# Patient Record
Sex: Male | Born: 2000 | Race: Black or African American | Hispanic: No | Marital: Single | Smoking: Never smoker
Health system: Southern US, Community
[De-identification: ages and names within clinical notes are randomized; demographics above are authoritative.]

## PROBLEM LIST (undated history)

## (undated) DIAGNOSIS — F988 Other specified behavioral and emotional disorders with onset usually occurring in childhood and adolescence: Secondary | ICD-10-CM

## (undated) HISTORY — DX: Other specified behavioral and emotional disorders with onset usually occurring in childhood and adolescence: F98.8

---
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
## 2021-04-14 ENCOUNTER — Encounter: Payer: Self-pay | Admitting: Emergency Medicine

## 2021-04-14 ENCOUNTER — Other Ambulatory Visit: Payer: Self-pay

## 2021-04-14 ENCOUNTER — Ambulatory Visit
Admission: EM | Admit: 2021-04-14 | Discharge: 2021-04-14 | Disposition: A | Discharge status: Home / Self Care | Payer: Medicaid Other | Attending: Family Medicine | Admitting: Family Medicine

## 2021-04-14 DIAGNOSIS — J111 Influenza due to unidentified influenza virus with other respiratory manifestations: Secondary | ICD-10-CM

## 2021-04-14 LAB — POCT INFLUENZA A/B
Influenza A, POC: NEGATIVE
Influenza B, POC: NEGATIVE

## 2021-04-14 NOTE — ED Provider Notes (Signed)
  [facility]   [id number] 04/14/21 Arrival Time: 1357  ASSESSMENT & PLAN:  1. Influenza-like illness    Rapid flu testing negative. Discussed typical duration of viral illnesses. Work note provided. OTC symptom care as needed.   Follow-up Information    Luking, Jonna Coup, MD.   Specialty: Family Medicine Why: As needed. Contact information: [address] [place] [address] [phone]               Reviewed expectations re: course of current medical issues. Questions answered. Outlined signs and symptoms indicating need for more acute intervention. Understanding verbalized. After Visit Summary given.   SUBJECTIVE: History from: patient. Kenneth Webster is a 20 y.o. male who reports influenza exposure. With body aches, ST, HA, "feeling hot" since yesterday; mild cough. Denies: fever and difficulty breathing. Normal PO intake without n/v/d.  OBJECTIVE:  Vitals:   04/14/21 1408  BP: 126/71  Pulse: 86  Resp: 17  Temp: 99 F (37.2 C)  TempSrc: Oral  SpO2: 96%    General appearance: alert; no distress; appears fatigued Eyes: PERRLA; EOMI; conjunctiva normal HENT: [place]; AT; with nasal congestion Neck: supple  Lungs: speaks full sentences without difficulty; unlabored; CTAB Extremities: no edema Skin: warm and dry Neurologic: normal gait Psychological: alert and cooperative; normal mood and affect  Labs: Results for orders placed or performed during the hospital encounter of 04/14/21  POCT Influenza A/B  Result Value Ref Range   Influenza A, POC Negative Negative   Influenza B, POC Negative Negative   Labs Reviewed  POCT INFLUENZA A/B    No Known Allergies  Past Medical History:  Diagnosis Date  . ADD (attention deficit disorder)    Social History   Socioeconomic History  . Marital status: Single    Spouse name: Not on file  . Number of children: Not on file  . Years of education: Not on file  . Highest education  level: Not on file  Occupational History  . Not on file  Tobacco Use  . Smoking status: Never Smoker  . Smokeless tobacco: Never Used  Substance and Sexual Activity  . Alcohol use: Never  . Drug use: Never  . Sexual activity: Not on file  Other Topics Concern  . Not on file  Social History Narrative  . Not on file   Social Determinants of Health   Financial Resource Strain: Not on file  Food Insecurity: Not on file  Transportation Needs: Not on file  Physical Activity: Not on file  Stress: Not on file  Social Connections: Not on file  Intimate Partner Violence: Not on file   No family history on file. History reviewed. No pertinent surgical history.   Mardella Layman, MD 04/14/21 [phone]

## 2021-04-14 NOTE — ED Triage Notes (Signed)
Body aches, sore throat, headache, feels hot since yesterday.  Was around someone who had the flu recently.

## 2021-04-14 NOTE — Discharge Instructions (Signed)
Follow up with your primary care doctor or here if you are not seeing improvement of your symptoms over the next several days, sooner if you feel you are worsening.  Caring for yourself: Get plenty of rest. Drink plenty of fluids, enough so that your urine is light yellow or clear like water. If you have kidney, heart, or liver disease and have to limit fluids, talk with your doctor before you increase the amount of fluids you drink. Take an over-the-counter pain medicine if needed, such as acetaminophen (Tylenol), ibuprofen (Advil, Motrin), or naproxen (Aleve), to relieve fever, headache, and muscle aches. Read and follow all instructions on the label. No one younger than 20 should take aspirin. It has been linked to Reye syndrome, a serious illness. Before you use over the counter cough and cold medicines, check the label. These medicines may not be safe for children younger than age 6 or for people with certain health problems. If the skin around your nose and lips becomes sore, put some petroleum jelly on the area.  Avoid spreading a virus: Wash your hands regularly, and keep your hands away from your face.  Stay home from school, work, and other public places until you are feeling better and your fever has been gone for at least 24 hours. The fever needs to have gone away on its own without the help of medicine.  

## 2021-12-29 ENCOUNTER — Encounter (HOSPITAL_COMMUNITY): Payer: Self-pay | Admitting: *Deleted

## 2021-12-29 ENCOUNTER — Other Ambulatory Visit: Payer: Self-pay

## 2021-12-29 DIAGNOSIS — K029 Dental caries, unspecified: Secondary | ICD-10-CM | POA: Diagnosis not present

## 2021-12-29 DIAGNOSIS — K0889 Other specified disorders of teeth and supporting structures: Secondary | ICD-10-CM | POA: Diagnosis not present

## 2021-12-29 NOTE — ED Triage Notes (Signed)
Pt with right upper dental pain for past 2 weeks but worse today.  Has tried OTC gel and no relief per pt.

## 2021-12-30 ENCOUNTER — Emergency Department (HOSPITAL_COMMUNITY)
Admission: EM | Admit: 2021-12-30 | Discharge: 2021-12-30 | Disposition: A | Discharge status: Home / Self Care | Payer: Medicaid Other | Attending: Emergency Medicine | Admitting: Emergency Medicine

## 2021-12-30 DIAGNOSIS — K0889 Other specified disorders of teeth and supporting structures: Secondary | ICD-10-CM

## 2021-12-30 MED ORDER — HYDROCODONE-ACETAMINOPHEN 5-325 MG PO TABS
1.0000 | ORAL_TABLET | Freq: Once | ORAL | Status: AC
  Administered 2021-12-30: 1 via ORAL
  Filled 2021-12-30: qty 1

## 2021-12-30 MED ORDER — AMOXICILLIN 500 MG PO CAPS: 500.0000 mg | ORAL_CAPSULE | Freq: Three times a day (TID) | ORAL | 0 refills | Status: AC

## 2021-12-30 MED ORDER — IBUPROFEN 800 MG PO TABS: 800.0000 mg | ORAL_TABLET | Freq: Four times a day (QID) | ORAL | 0 refills | Status: AC | PRN

## 2021-12-30 MED ORDER — IBUPROFEN 800 MG PO TABS
800.0000 mg | ORAL_TABLET | Freq: Once | ORAL | Status: AC
  Administered 2021-12-30: 800 mg via ORAL
  Filled 2021-12-30: qty 1

## 2021-12-30 MED ORDER — AMOXICILLIN 250 MG PO CAPS
500.0000 mg | ORAL_CAPSULE | Freq: Once | ORAL | Status: AC
  Administered 2021-12-30: 500 mg via ORAL
  Filled 2021-12-30: qty 2

## 2021-12-30 NOTE — ED Provider Notes (Signed)
[facility] EMERGENCY DEPARTMENT Provider Note   CSN: [id number] Arrival date & time: 12/29/21  2225     History  Chief Complaint  Patient presents with   Dental Pain    Kenneth Webster is a 21 y.o. male.  Patient presents to the emergency department for evaluation of dental pain.  Patient reports that he has been having a tooth ache for 2 weeks but it significantly worsened tonight.  He has tried over-the-counter medications without improvement.      Home Medications Prior to Admission medications   Medication Sig Start Date End Date Taking? Authorizing Provider  amoxicillin (AMOXIL) 500 MG capsule Take 1 capsule (500 mg total) by mouth 3 (three) times daily. 12/30/21  Yes Evens Meno, Gwenyth Allegra, MD  ibuprofen (ADVIL) 800 MG tablet Take 1 tablet (800 mg total) by mouth every 6 (six) hours as needed for moderate pain. 12/30/21  Yes Yurika Pereda, Gwenyth Allegra, MD  loratadine (CLARITIN) 10 MG tablet TAKE 1 TABLET BY MOUTH ONCE DAILY. Patient not taking: Reported on 04/20/2016 05/07/15   Mikey Kirschner, MD  methylphenidate (METADATE CD) 20 MG CR capsule Take 1 capsule (20 mg total) by mouth every morning. 04/20/16   Kathyrn Drown, MD      Allergies    Patient has no known allergies.    Review of Systems   Review of Systems  HENT:  Positive for dental problem.    Physical Exam Updated Vital Signs BP (!) 151/112 (BP Location: Right Arm)    Pulse 67    Temp 98.4 F (36.9 C) (Oral)    Resp 18    Ht 5\' 8"  (1.727 m)    Wt 56.7 kg    SpO2 98%    BMI 19.01 kg/m  Physical Exam Vitals and nursing note reviewed.  Constitutional:      General: He is not in acute distress.    Appearance: Normal appearance. He is well-developed.  HENT:     Head: Normocephalic and atraumatic.     Right Ear: Hearing normal.     Left Ear: Hearing normal.     Nose: Nose normal.     Mouth/Throat:     Dentition: Dental tenderness and dental caries present. No dental abscesses.   Eyes:      Conjunctiva/sclera: Conjunctivae normal.     Pupils: Pupils are equal, round, and reactive to light.  Cardiovascular:     Rate and Rhythm: Regular rhythm.     Heart sounds: S1 normal and S2 normal. No murmur heard.   No friction rub. No gallop.  Pulmonary:     Effort: Pulmonary effort is normal. No respiratory distress.     Breath sounds: Normal breath sounds.  Chest:     Chest wall: No tenderness.  Abdominal:     General: Bowel sounds are normal.     Palpations: Abdomen is soft.     Tenderness: There is no abdominal tenderness. There is no guarding or rebound. Negative signs include Murphy's sign and McBurney's sign.     Hernia: No hernia is present.  Musculoskeletal:        General: Normal range of motion.     Cervical back: Normal range of motion and neck supple.  Skin:    General: Skin is warm and dry.     Findings: No rash.  Neurological:     Mental Status: He is alert and oriented to person, place, and time.     GCS: GCS eye subscore is 4. GCS  verbal subscore is 5. GCS motor subscore is 6.     Cranial Nerves: No cranial nerve deficit.     Sensory: No sensory deficit.     Coordination: Coordination normal.  Psychiatric:        Speech: Speech normal.        Behavior: Behavior normal.        Thought Content: Thought content normal.    ED Results / Procedures / Treatments   Labs (all labs ordered are listed, but only abnormal results are displayed) Labs Reviewed - No data to display  EKG None  Radiology No results found.  Procedures Procedures    Medications Ordered in ED Medications - No data to display  ED Course/ Medical Decision Making/ A&P                           Medical Decision Making  Patient with dental pain associated with a large cavity of right upper premolar without abscess formation.        Final Clinical Impression(s) / ED Diagnoses Final diagnoses:  Pain, dental    Rx / DC Orders ED Discharge Orders          Ordered     amoxicillin (AMOXIL) 500 MG capsule  3 times daily        12/30/21 0035    ibuprofen (ADVIL) 800 MG tablet  Every 6 hours PRN        12/30/21 0035              Orpah Greek, MD 12/30/21 [phone]

## 2021-12-31 ENCOUNTER — Telehealth: Payer: Self-pay

## 2021-12-31 NOTE — Telephone Encounter (Signed)
Transition Care Management Unsuccessful Follow-up Telephone Call  Date of discharge and from where:  01/18//2023 from [facility]  Attempts:  1st Attempt  Reason for unsuccessful TCM follow-up call:  Left voice message

## 2022-01-01 NOTE — Telephone Encounter (Signed)
Transition Care Management Unsuccessful Follow-up Telephone Call  Date of discharge and from where:  12/30/2021 from [facility]  Attempts:  2nd Attempt  Reason for unsuccessful TCM follow-up call:  No answer/busy

## 2022-01-04 NOTE — Telephone Encounter (Signed)
Transition Care Management Unsuccessful Follow-up Telephone Call  Date of discharge and from where:  12/30/2021-[facility]   Attempts:  3rd Attempt  Reason for unsuccessful TCM follow-up call:  No answer/busy

## 2022-01-18 ENCOUNTER — Ambulatory Visit
Admission: EM | Admit: 2022-01-18 | Discharge: 2022-01-18 | Disposition: A | Discharge status: Home / Self Care | Payer: Medicaid Other | Attending: Family Medicine | Admitting: Family Medicine

## 2022-01-18 ENCOUNTER — Other Ambulatory Visit: Payer: Self-pay

## 2022-01-18 DIAGNOSIS — R112 Nausea with vomiting, unspecified: Secondary | ICD-10-CM | POA: Diagnosis not present

## 2022-01-18 MED ORDER — ONDANSETRON 4 MG PO TBDP
4.0000 mg | ORAL_TABLET | Freq: Once | ORAL | Status: AC
  Administered 2022-01-18: 4 mg via ORAL

## 2022-01-18 MED ORDER — ONDANSETRON 4 MG PO TBDP: 4.0000 mg | ORAL_TABLET | Freq: Three times a day (TID) | ORAL | 0 refills | Status: DC | PRN

## 2022-01-18 NOTE — ED Triage Notes (Signed)
Pt presents with nausea and vomiting that began last night after eating chicken last night

## 2022-01-19 NOTE — ED Provider Notes (Signed)
[facility]    CSN: [id number] Arrival date & time: 01/18/22  1518      History   Chief Complaint Chief Complaint  Patient presents with   Nausea   Emesis    HPI Kenneth Webster is a 21 y.o. male.   Presenting today with 1 day history of nausea, vomiting, generalized abdominal pain that started last night after eating some chicken.  Denies diarrhea, fever, chills, body aches, headache, upper respiratory symptoms.  So far not trying anything over-the-counter for symptoms.  States he has not eaten all day and that seems to have helped improve his symptoms somewhat.  No known sick contacts recently.  No known chronic GI issues.   Past Medical History:  Diagnosis Date   ADD (attention deficit disorder)     Patient Active Problem List   Diagnosis Date Noted   ADD (attention deficit disorder) 05/17/2013    History reviewed. No pertinent surgical history.     Home Medications    Prior to Admission medications   Medication Sig Start Date End Date Taking? Authorizing Provider  ondansetron (ZOFRAN-ODT) 4 MG disintegrating tablet Take 1 tablet (4 mg total) by mouth every 8 (eight) hours as needed for nausea or vomiting. 01/18/22  Yes Particia Nearing, PA-C  amoxicillin (AMOXIL) 500 MG capsule Take 1 capsule (500 mg total) by mouth 3 (three) times daily. 12/30/21   Gilda Crease, MD  ibuprofen (ADVIL) 800 MG tablet Take 1 tablet (800 mg total) by mouth every 6 (six) hours as needed for moderate pain. 12/30/21   Gilda Crease, MD  loratadine (CLARITIN) 10 MG tablet TAKE 1 TABLET BY MOUTH ONCE DAILY. Patient not taking: Reported on 04/20/2016 05/07/15   Merlyn Albert, MD  methylphenidate (METADATE CD) 20 MG CR capsule Take 1 capsule (20 mg total) by mouth every morning. 04/20/16   Babs Sciara, MD    Family History History reviewed. No pertinent family history.  Social History Social History   Tobacco Use   Smoking status: Never    Smokeless tobacco: Never  Substance Use Topics   Alcohol use: Never   Drug use: Never     Allergies   Patient has no known allergies.   Review of Systems Review of Systems Per HPI  Physical Exam Triage Vital Signs ED Triage Vitals  Enc Vitals Group     BP 01/18/22 1718 100/66     Pulse Rate 01/18/22 1718 62     Resp 01/18/22 1718 20     Temp 01/18/22 1718 98.3 F (36.8 C)     Temp src --      SpO2 01/18/22 1718 98 %     Weight --      Height --      Head Circumference --      Peak Flow --      Pain Score 01/18/22 1717 0     Pain Loc --      Pain Edu? --      Excl. in GC? --    No data found.  Updated Vital Signs BP 100/66    Pulse 62    Temp 98.3 F (36.8 C)    Resp 20    SpO2 98%   Visual Acuity Right Eye Distance:   Left Eye Distance:   Bilateral Distance:    Right Eye Near:   Left Eye Near:    Bilateral Near:     Physical Exam Vitals and nursing note reviewed.  Constitutional:      Appearance: Normal appearance.  HENT:     Head: Atraumatic.     Mouth/Throat:     Mouth: Mucous membranes are moist.  Eyes:     Extraocular Movements: Extraocular movements intact.     Conjunctiva/sclera: Conjunctivae normal.  Cardiovascular:     Rate and Rhythm: Normal rate and regular rhythm.  Pulmonary:     Effort: Pulmonary effort is normal.     Breath sounds: Normal breath sounds.  Abdominal:     General: Bowel sounds are normal. There is no distension.     Palpations: Abdomen is soft.     Tenderness: There is no abdominal tenderness. There is no right CVA tenderness, left CVA tenderness or guarding.  Musculoskeletal:        General: Normal range of motion.     Cervical back: Normal range of motion and neck supple.  Skin:    General: Skin is warm and dry.  Neurological:     General: No focal deficit present.     Mental Status: He is oriented to person, place, and time.  Psychiatric:        Mood and Affect: Mood normal.        Thought Content: Thought  content normal.        Judgment: Judgment normal.     UC Treatments / Results  Labs (all labs ordered are listed, but only abnormal results are displayed) Labs Reviewed - No data to display  EKG   Radiology No results found.  Procedures Procedures (including critical care time)  Medications Ordered in UC Medications  ondansetron (ZOFRAN-ODT) disintegrating tablet 4 mg (4 mg Oral Given 01/18/22 1812)    Initial Impression / Assessment and Plan / UC Course  I have reviewed the triage vital signs and the nursing notes.  Pertinent labs & imaging results that were available during my care of the patient were reviewed by me and considered in my medical decision making (see chart for details).     Vital signs benign and reassuring, exam with no red flag findings.  Zofran given for acute nausea in clinic, Zofran also sent to pharmacy.  Suspect viral GI illness causing symptoms.  Brat diet, fluids, work note given.  Return for acutely worsening symptoms.  Final Clinical Impressions(s) / UC Diagnoses   Final diagnoses:  Nausea and vomiting, unspecified vomiting type   Discharge Instructions   None    ED Prescriptions     Medication Sig Dispense Auth. Provider   ondansetron (ZOFRAN-ODT) 4 MG disintegrating tablet Take 1 tablet (4 mg total) by mouth every 8 (eight) hours as needed for nausea or vomiting. 20 tablet Particia Nearing, [place]      PDMP not reviewed this encounter.   Roosvelt Maser Palmer, [place] 01/19/22 [phone]

## 2022-02-05 ENCOUNTER — Ambulatory Visit
Admission: EM | Admit: 2022-02-05 | Discharge: 2022-02-05 | Disposition: A | Discharge status: Home / Self Care | Payer: Medicaid Other | Attending: Family Medicine | Admitting: Family Medicine

## 2022-02-05 ENCOUNTER — Encounter: Payer: Self-pay | Admitting: Emergency Medicine

## 2022-02-05 ENCOUNTER — Other Ambulatory Visit: Payer: Self-pay

## 2022-02-05 DIAGNOSIS — R519 Headache, unspecified: Secondary | ICD-10-CM | POA: Diagnosis not present

## 2022-02-05 DIAGNOSIS — R110 Nausea: Secondary | ICD-10-CM | POA: Diagnosis not present

## 2022-02-05 MED ORDER — ONDANSETRON 4 MG PO TBDP: 4.0000 mg | ORAL_TABLET | Freq: Three times a day (TID) | ORAL | 0 refills | Status: AC | PRN

## 2022-02-05 NOTE — ED Provider Notes (Signed)
[facility]    CSN: [id number] Arrival date & time: 02/05/22  1428      History   Chief Complaint No chief complaint on file.   HPI Kenneth Webster is a 21 y.o. male.   Presenting today with 1 day history of nausea, malaise, headache.  Denies vomiting, diarrhea, abdominal pain, chest pain, shortness of breath, cough, sore throat, diarrhea, constipation.  So far not trying anything over-the-counter for symptoms.  No new foods, medications recently.  No new sick contacts recently.   Past Medical History:  Diagnosis Date   ADD (attention deficit disorder)     Patient Active Problem List   Diagnosis Date Noted   ADD (attention deficit disorder) 05/17/2013    History reviewed. No pertinent surgical history.     Home Medications    Prior to Admission medications   Medication Sig Start Date End Date Taking? Authorizing Provider  amoxicillin (AMOXIL) 500 MG capsule Take 1 capsule (500 mg total) by mouth 3 (three) times daily. 12/30/21   Gilda Crease, MD  ibuprofen (ADVIL) 800 MG tablet Take 1 tablet (800 mg total) by mouth every 6 (six) hours as needed for moderate pain. 12/30/21   Gilda Crease, MD  loratadine (CLARITIN) 10 MG tablet TAKE 1 TABLET BY MOUTH ONCE DAILY. Patient not taking: Reported on 04/20/2016 05/07/15   Merlyn Albert, MD  methylphenidate (METADATE CD) 20 MG CR capsule Take 1 capsule (20 mg total) by mouth every morning. 04/20/16   Babs Sciara, MD  ondansetron (ZOFRAN-ODT) 4 MG disintegrating tablet Take 1 tablet (4 mg total) by mouth every 8 (eight) hours as needed for nausea or vomiting. 02/05/22   Particia Nearing, PA-C    Family History Family History  Problem Relation Age of Onset   Healthy Mother    COPD Father     Social History Social History   Tobacco Use   Smoking status: Never   Smokeless tobacco: Never  Substance Use Topics   Alcohol use: Never   Drug use: Never     Allergies   Patient  has no known allergies.   Review of Systems Review of Systems Per HPI  Physical Exam Triage Vital Signs ED Triage Vitals  Enc Vitals Group     BP 02/05/22 1508 117/64     Pulse Rate 02/05/22 1508 84     Resp 02/05/22 1508 18     Temp 02/05/22 1508 98.1 F (36.7 C)     Temp Source 02/05/22 1508 Oral     SpO2 02/05/22 1508 98 %     Weight --      Height --      Head Circumference --      Peak Flow --      Pain Score 02/05/22 1509 0     Pain Loc --      Pain Edu? --      Excl. in GC? --    No data found.  Updated Vital Signs BP 117/64 (BP Location: Right Arm)    Pulse 84    Temp 98.1 F (36.7 C) (Oral)    Resp 18    SpO2 98%   Visual Acuity Right Eye Distance:   Left Eye Distance:   Bilateral Distance:    Right Eye Near:   Left Eye Near:    Bilateral Near:     Physical Exam Vitals and nursing note reviewed.  Constitutional:      Appearance: Normal appearance.  HENT:     Head: Atraumatic.     Mouth/Throat:     Mouth: Mucous membranes are moist.     Pharynx: Oropharynx is clear.  Eyes:     Extraocular Movements: Extraocular movements intact.     Conjunctiva/sclera: Conjunctivae normal.  Cardiovascular:     Rate and Rhythm: Normal rate and regular rhythm.  Pulmonary:     Effort: Pulmonary effort is normal.     Breath sounds: Normal breath sounds.  Abdominal:     General: Bowel sounds are normal. There is no distension.     Palpations: Abdomen is soft.     Tenderness: There is no abdominal tenderness. There is no right CVA tenderness, left CVA tenderness or guarding.  Musculoskeletal:        General: Normal range of motion.     Cervical back: Normal range of motion and neck supple.  Skin:    General: Skin is warm and dry.  Neurological:     General: No focal deficit present.     Mental Status: He is oriented to person, place, and time.  Psychiatric:        Mood and Affect: Mood normal.        Thought Content: Thought content normal.        Judgment:  Judgment normal.     UC Treatments / Results  Labs (all labs ordered are listed, but only abnormal results are displayed) Labs Reviewed - No data to display  EKG   Radiology No results found.  Procedures Procedures (including critical care time)  Medications Ordered in UC Medications - No data to display  Initial Impression / Assessment and Plan / UC Course  I have reviewed the triage vital signs and the nursing notes.  Pertinent labs & imaging results that were available during my care of the patient were reviewed by me and considered in my medical decision making (see chart for details).     Vitals and exam benign and reassuring, unclear if early viral GI illness causing symptoms or possibly related to something he ate.  Treat conservatively with Zofran, brat diet, fluids.  He declines any viral testing today or labs for further evaluation.  Work note given.  Return for acutely worsening symptom  Final Clinical Impressions(s) / UC Diagnoses   Final diagnoses:  Nausea without vomiting  Acute nonintractable headache, unspecified headache type   Discharge Instructions   None    ED Prescriptions     Medication Sig Dispense Auth. Provider   ondansetron (ZOFRAN-ODT) 4 MG disintegrating tablet Take 1 tablet (4 mg total) by mouth every 8 (eight) hours as needed for nausea or vomiting. 20 tablet Particia Nearing, [place]      PDMP not reviewed this encounter.   Particia Nearing, [place] 02/05/22 1609

## 2022-02-05 NOTE — ED Triage Notes (Signed)
Nauseated and feeling lightheaded.  States he had a headache last night.

## 2023-03-12 ENCOUNTER — Other Ambulatory Visit: Payer: Self-pay

## 2023-03-12 ENCOUNTER — Emergency Department (HOSPITAL_COMMUNITY)
Admission: EM | Admit: 2023-03-12 | Discharge: 2023-03-12 | Disposition: A | Discharge status: Home / Self Care | Payer: Medicaid Other | Attending: Emergency Medicine | Admitting: Emergency Medicine

## 2023-03-12 DIAGNOSIS — A6000 Herpesviral infection of urogenital system, unspecified: Secondary | ICD-10-CM

## 2023-03-12 DIAGNOSIS — B009 Herpesviral infection, unspecified: Secondary | ICD-10-CM | POA: Diagnosis not present

## 2023-03-12 DIAGNOSIS — J029 Acute pharyngitis, unspecified: Secondary | ICD-10-CM | POA: Diagnosis present

## 2023-03-12 LAB — URINALYSIS, ROUTINE W REFLEX MICROSCOPIC
Bacteria, UA: NONE SEEN
Bilirubin Urine: NEGATIVE
Glucose, UA: NEGATIVE mg/dL
Ketones, ur: NEGATIVE mg/dL
Leukocytes,Ua: NEGATIVE
Nitrite: NEGATIVE
Protein, ur: 30 mg/dL — AB
RBC / HPF: >50 RBC/hpf (ref 0–5)
Specific Gravity, Urine: 1.012 (ref 1.005–1.030)
pH: 7 (ref 5.0–8.0)

## 2023-03-12 LAB — GROUP A STREP BY PCR: Group A Strep by PCR: NOT DETECTED

## 2023-03-12 MED ORDER — ACYCLOVIR 400 MG PO TABS: 400.0000 mg | ORAL_TABLET | Freq: Four times a day (QID) | ORAL | 0 refills | Status: AC

## 2023-03-12 MED ORDER — ACYCLOVIR 800 MG PO TABS
800.0000 mg | ORAL_TABLET | Freq: Once | ORAL | Status: AC
  Administered 2023-03-12: 800 mg via ORAL
  Filled 2023-03-12: qty 1

## 2023-03-12 NOTE — Discharge Instructions (Addendum)
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  I am starting you on an 8-day course of acyclovir which is an antiviral.  Also I have sent off of the STD panel.  Please follow-up on your MyChart for results.  If you are positive for an STD, please seek treatment at the health department or you can come back to the ED for treatment.  In the meantime would abstain from sex until you have results from the STD panel.

## 2023-03-12 NOTE — ED Triage Notes (Signed)
Hurts to swallow, started day before yesterday. No fever. Did use over the counter throat medicine.

## 2023-03-12 NOTE — ED Notes (Signed)
Lab here to obtain samples.

## 2023-03-13 LAB — HIV ANTIBODY (ROUTINE TESTING W REFLEX): HIV Screen 4th Generation wRfx: NONREACTIVE

## 2023-03-13 LAB — RPR: RPR Ser Ql: NONREACTIVE

## 2023-03-13 NOTE — ED Provider Notes (Signed)
[facility] Provider Note   CSN: [id number] Arrival date & time: 03/12/23  1551     History  Chief Complaint  Patient presents with   Sore Throat    For 2 days.    HPI Kenneth Webster is a 22 y.o. male presenting for sore throat and rash on his penis.  Sore throat started 2 days ago.  Denies trouble swallowing, drooling or breathing.  Denies fever or cough.  States it "just hurts all the time".  He has been using over-the-counter medicine to treat his throat pain. Denies any sick contacts.  Also states that he noticed a rash on the shaft of his penis yesterday.  States he is sexually active with his girlfriend and no one else.  Denies painful urination, abnormal penile discharge or hematuria.   Sore Throat       Home Medications Prior to Admission medications   Medication Sig Start Date End Date Taking? Authorizing Provider  acyclovir (ZOVIRAX) 400 MG tablet Take 1 tablet (400 mg total) by mouth 4 (four) times daily for 8 days. 03/12/23 03/20/23 Yes [name], PA-C  amoxicillin (AMOXIL) 500 MG capsule Take 1 capsule (500 mg total) by mouth 3 (three) times daily. 12/30/21   Orpah Greek, MD  ibuprofen (ADVIL) 800 MG tablet Take 1 tablet (800 mg total) by mouth every 6 (six) hours as needed for moderate pain. 12/30/21   Orpah Greek, MD  loratadine (CLARITIN) 10 MG tablet TAKE 1 TABLET BY MOUTH ONCE DAILY. Patient not taking: Reported on 04/20/2016 05/07/15   Mikey Kirschner, MD  methylphenidate (METADATE CD) 20 MG CR capsule Take 1 capsule (20 mg total) by mouth every morning. 04/20/16   Kathyrn Drown, MD  ondansetron (ZOFRAN-ODT) 4 MG disintegrating tablet Take 1 tablet (4 mg total) by mouth every 8 (eight) hours as needed for nausea or vomiting. 02/05/22   Volney American, PA-C      Allergies    Patient has no known allergies.    Review of Systems   See HPI for pertinent positives   Physical Exam    Vitals:   03/12/23 1603 03/12/23 1606  BP:  128/70  Pulse:  80  Resp:  17  Temp: 98.7 F (37.1 C)   SpO2:  98%    CONSTITUTIONAL:  well-appearing, NAD NEURO:  Alert and oriented x 3, CN 3-12 grossly intact EYES:  eyes equal and reactive ENT/NECK:  Supple, no stridor.  Peritonsillar erythema and swelling, no exudate.  Uvula is erythematous but midline. CARDIO: appears well-perfused  PULM:  No respiratory distress GI/GU:  non-distended, soft.  Ulcerated rash noted diffusely about the shaft of the penis.  Scrotum, testes appear normal without rash.  No tenderness overlying the epididymis.  No evidence of inguinal hernia. MSK/SPINE:  No gross deformities, no edema, moves all extremities  SKIN:  no rash, atraumatic   *Additional and/or pertinent findings included in MDM below    ED Results / Procedures / Treatments   Labs (all labs ordered are listed, but only abnormal results are displayed) Labs Reviewed  URINALYSIS, ROUTINE W REFLEX MICROSCOPIC - Abnormal; Notable for the following components:      Result Value   Hgb urine dipstick LARGE (*)    Protein, ur 30 (*)    All other components within normal limits  GROUP A STREP BY PCR  HSV CULTURE AND TYPING  RPR  HIV ANTIBODY (ROUTINE TESTING W REFLEX)  GC/CHLAMYDIA PROBE AMP ([place]) NOT AT [facility]    EKG None  Radiology No results found.  Procedures Procedures    Medications Ordered in ED Medications  acyclovir (ZOVIRAX) tablet 800 mg (800 mg Oral Given 03/12/23 1803)    ED Course/ Medical Decision Making/ A&P                             Medical Decision Making Amount and/or Complexity of Data Reviewed Labs: ordered.  Risk Prescription drug management.   22 year old well-appearing male presenting for sore throat and penile rash.  Exam was concerning for possible strep pharyngitis.  However strep PCR was negative and patient has not had a fever.  Therefore did not warrant treatment.  Advised to  continue taking Tylenol ibuprofen as needed for symptomatic relief.  Also advised to follow-up with his PCP.  Exam of his penis did reveal concern for possible herpetic lesion.  Treated with acyclovir.  Sent STI panel.  Advised him to follow-up on MyChart for results.  Patient assured me that he did not have any symptoms concerning for possible urethritis.  UA did reveal hematuria.  Patient requested that he follow-up on MyChart and if he is positive for STD he assured me that he would seek treatment.  I agree that this was a reasonable plan.  Advised to abstain from sex until results are posted.  Advised to follow-up with his PCP.        Final Clinical Impression(s) / ED Diagnoses Final diagnoses:  Genital herpes simplex, unspecified site    Rx / DC Orders ED Discharge Orders          Ordered    acyclovir (ZOVIRAX) 400 MG tablet  4 times daily        03/12/23 1837              [name], PA-C 03/13/23 [place], [place], DO 03/14/23 2044

## 2023-03-14 LAB — GC/CHLAMYDIA PROBE AMP (~~LOC~~) NOT AT ARMC
Chlamydia: NEGATIVE
Comment: NEGATIVE
Comment: NORMAL
Neisseria Gonorrhea: NEGATIVE

## 2023-03-17 LAB — HSV CULTURE AND TYPING

## 2023-07-21 ENCOUNTER — Encounter (HOSPITAL_COMMUNITY): Payer: Self-pay

## 2023-07-21 ENCOUNTER — Emergency Department (HOSPITAL_COMMUNITY): Payer: Medicaid Other

## 2023-07-21 ENCOUNTER — Emergency Department (HOSPITAL_COMMUNITY)
Admission: EM | Admit: 2023-07-21 | Discharge: 2023-07-21 | Disposition: A | Discharge status: Home / Self Care | Payer: Medicaid Other

## 2023-07-21 ENCOUNTER — Other Ambulatory Visit: Payer: Self-pay

## 2023-07-21 DIAGNOSIS — Y92410 Unspecified street and highway as the place of occurrence of the external cause: Secondary | ICD-10-CM | POA: Insufficient documentation

## 2023-07-21 DIAGNOSIS — M25512 Pain in left shoulder: Secondary | ICD-10-CM | POA: Insufficient documentation

## 2023-07-21 DIAGNOSIS — S46912A Strain of unspecified muscle, fascia and tendon at shoulder and upper arm level, left arm, initial encounter: Secondary | ICD-10-CM | POA: Diagnosis not present

## 2023-07-21 DIAGNOSIS — M25519 Pain in unspecified shoulder: Secondary | ICD-10-CM | POA: Diagnosis not present

## 2023-07-21 MED ORDER — OXYCODONE-ACETAMINOPHEN 5-325 MG PO TABS
1.0000 | ORAL_TABLET | Freq: Once | ORAL | Status: AC
  Administered 2023-07-21: 1 via ORAL
  Filled 2023-07-21: qty 1

## 2023-07-21 NOTE — Discharge Instructions (Signed)
He may take over-the-counter Tylenol alternating with Motrin for pain.  Please return immediately for fevers, chills, sudden onset headache, vision changes, severe pain, numbness tingling changes in sensation in your extremities, chest pain, abdominal pain or any new or worsening symptoms that are concerning to you.
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
